# Patient Record
Sex: Male | Born: 1965 | Race: Black or African American | Hispanic: No | Smoking: Current every day smoker
Health system: Southern US, Community
[De-identification: ages and names within clinical notes are randomized; demographics above are authoritative.]

## PROBLEM LIST (undated history)

## (undated) DIAGNOSIS — K5792 Diverticulitis of intestine, part unspecified, without perforation or abscess without bleeding: Secondary | ICD-10-CM

---

## 2017-03-31 HISTORY — PX: COLONOSCOPY W/ POLYPECTOMY: SHX1380

## 2018-04-07 ENCOUNTER — Emergency Department (HOSPITAL_COMMUNITY): Payer: Self-pay

## 2018-04-07 ENCOUNTER — Encounter (HOSPITAL_COMMUNITY): Payer: Self-pay | Admitting: Emergency Medicine

## 2018-04-07 ENCOUNTER — Other Ambulatory Visit: Payer: Self-pay

## 2018-04-07 ENCOUNTER — Emergency Department (HOSPITAL_COMMUNITY)
Admission: EM | Admit: 2018-04-07 | Discharge: 2018-04-07 | Disposition: A | Discharge status: Home / Self Care | Payer: Self-pay | Attending: Emergency Medicine | Admitting: Emergency Medicine

## 2018-04-07 DIAGNOSIS — R1032 Left lower quadrant pain: Secondary | ICD-10-CM | POA: Insufficient documentation

## 2018-04-07 DIAGNOSIS — F17210 Nicotine dependence, cigarettes, uncomplicated: Secondary | ICD-10-CM | POA: Insufficient documentation

## 2018-04-07 HISTORY — DX: Diverticulitis of intestine, part unspecified, without perforation or abscess without bleeding: K57.92

## 2018-04-07 LAB — CBC
HCT: 42.5 % (ref 39.0–52.0)
Hemoglobin: 13.3 g/dL (ref 13.0–17.0)
MCH: 29.4 pg (ref 26.0–34.0)
MCHC: 31.3 g/dL (ref 30.0–36.0)
MCV: 93.8 fL (ref 80.0–100.0)
NRBC: 0 % (ref 0.0–0.2)
Platelets: 197 10*3/uL (ref 150–400)
RBC: 4.53 MIL/uL (ref 4.22–5.81)
RDW: 13.2 % (ref 11.5–15.5)
WBC: 4.2 10*3/uL (ref 4.0–10.5)

## 2018-04-07 LAB — COMPREHENSIVE METABOLIC PANEL
ALT: 21 U/L (ref 0–44)
AST: 24 U/L (ref 15–41)
Albumin: 3.8 g/dL (ref 3.5–5.0)
Alkaline Phosphatase: 56 U/L (ref 38–126)
Anion gap: 7 (ref 5–15)
BUN: 12 mg/dL (ref 6–20)
CO2: 26 mmol/L (ref 22–32)
Calcium: 9.6 mg/dL (ref 8.9–10.3)
Chloride: 108 mmol/L (ref 98–111)
Creatinine, Ser: 1.41 mg/dL — ABNORMAL HIGH (ref 0.61–1.24)
GFR calc non Af Amer: 56 mL/min — ABNORMAL LOW (ref >60)
Glucose, Bld: 116 mg/dL — ABNORMAL HIGH (ref 70–99)
Potassium: 4 mmol/L (ref 3.5–5.1)
SODIUM: 141 mmol/L (ref 135–145)
Total Bilirubin: 0.9 mg/dL (ref 0.3–1.2)
Total Protein: 7.3 g/dL (ref 6.5–8.1)

## 2018-04-07 LAB — URINALYSIS, ROUTINE W REFLEX MICROSCOPIC
Bilirubin Urine: NEGATIVE
Glucose, UA: NEGATIVE mg/dL
Hgb urine dipstick: NEGATIVE
Ketones, ur: NEGATIVE mg/dL
LEUKOCYTES UA: NEGATIVE
Nitrite: NEGATIVE
Protein, ur: NEGATIVE mg/dL
Specific Gravity, Urine: 1.027 (ref 1.005–1.030)
pH: 6 (ref 5.0–8.0)

## 2018-04-07 LAB — LIPASE, BLOOD: Lipase: 31 U/L (ref 11–51)

## 2018-04-07 MED ORDER — IOHEXOL 300 MG/ML  SOLN
100.0000 mL | Freq: Once | INTRAMUSCULAR | Status: AC | PRN
  Administered 2018-04-07: 100 mL via INTRAVENOUS

## 2018-04-07 MED ORDER — SODIUM CHLORIDE 0.9 % IV BOLUS
1000.0000 mL | Freq: Once | INTRAVENOUS | Status: AC
  Administered 2018-04-07: 1000 mL via INTRAVENOUS

## 2018-04-07 NOTE — ED Provider Notes (Signed)
Care assumed from previous provider PA Leaphart. Please see note for further details. Case discussed, plan agreed upon. Will follow up on pending UA and CT abd/pelvis. If unremarkable, likely discharge to home with PCP follow up.   UA unremarkable. CT with no acute findings.   Gen: afebrile, VSS HEENT: Atraumatic Resp: no resp distress CV: RRR Abd: soft, no abdominal tenderness MsK: moving all extremities well Neuro: A&O x4   Patient updated on reassuring results.  PCP follow-up strongly encouraged.  Reasons to return to the emergency department were discussed.  All questions answered.   Ward, Chase Picket, PA-C 04/07/18 1713    Sabas Sous, MD 04/07/18 [phone]

## 2018-04-07 NOTE — ED Notes (Signed)
Patient verbalizes understanding of discharge instructions. Opportunity for questioning and answers were provided. Armband removed by staff, pt discharged from ED.  

## 2018-04-07 NOTE — ED Provider Notes (Signed)
[name] EMERGENCY [facility]PARTMENT Provider Note   CSN: [id number] Arrival date & time: 04/07/18  1252     History   Chief Complaint Chief Complaint  Patient presents with  . Abdominal Pain    HPI Andrew Frazier is a 53 y.o. male.  HPI 53 year old African-American male past medical history significant for diverticulitis presents to the emergency department today for evaluation of left lower quadrant abdominal pain and left flank pain.  The patient states this pain is been ongoing intermittently for the past 1 to 2 years.  Patient believes that his diverticulitis is flaring up.  Patient states that he was seen by GI doctor approximately 1 to 2 years ago where he had a colonoscopy withdrawn and polyps were removed.  Patient states that since then he had ongoing left lower quadrant abdominal pain.  Patient reports some mild nausea but denies any vomiting.  He states that he does have sometimes difficulties with passing stool and will hit his belly and stool come out.  Patient denies any urinary symptoms.  Denies any vomiting but reports some mild nausea.  Denies any fevers or chills.  He has not taken anything for his pain prior to arrival.  In the past patient has used MiraLAX for constipation which has helped.  He reports intermittent bloody stools and last episode was 1 week ago but denies any recent bloody stools.  Pain is not associated with food.  Pain is worse with palpation.  He states the pain will sometimes radiate down his left buttocks.  Denies any paresthesias or weakness.  Denies any recent trauma.  Denies any loss of bowel or bladder, saddle paresthesias, lower extremity paresthesias, urinary retention. Past Medical History:  Diagnosis Date  . Diverticulitis     There are no active problems to display for this patient.   Past Surgical History:  Procedure Laterality Date  . COLONOSCOPY W/ POLYPECTOMY  2019        Home Medications    Prior to Admission  medications   Not on File    Family History No family history on file.  Social History Social History   Tobacco Use  . Smoking status: Current Every Day Smoker    Types: Cigarettes  . Smokeless tobacco: Never Used  Substance Use Topics  . Alcohol use: Not Currently  . Drug use: Not Currently     Allergies   Patient has no known allergies.   Review of Systems Review of Systems  Constitutional: Negative for chills and fever.  Eyes: Negative for visual disturbance.  Respiratory: Negative for shortness of breath.   Cardiovascular: Negative for chest pain.  Gastrointestinal: Positive for abdominal pain and nausea. Negative for vomiting.  Genitourinary: Negative for dysuria, flank pain, frequency, hematuria and urgency.  Musculoskeletal: Negative for myalgias.  Skin: Negative for rash.  Neurological: Negative for headaches.     Physical Exam Updated Vital Signs BP 115/74 (BP Location: Left Arm)   Pulse 90   Temp 98.2 F (36.8 C) (Oral)   Resp 16   Ht 5\' 8"  (1.727 m)   Wt 70.3 kg   SpO2 98%   BMI 23.57 kg/m   Physical Exam Vitals signs and nursing note reviewed.  Constitutional:      General: He is not in acute distress.    Appearance: He is well-developed. He is not toxic-appearing.  HENT:     Head: Normocephalic and atraumatic.  Eyes:     General:  Right eye: No discharge.        Left eye: No discharge.     Conjunctiva/sclera: Conjunctivae normal.     Pupils: Pupils are equal, round, and reactive to light.  Neck:     Musculoskeletal: Normal range of motion and neck supple.  Cardiovascular:     Rate and Rhythm: Normal rate and regular rhythm.     Heart sounds: Normal heart sounds. No murmur. No friction rub. No gallop.   Pulmonary:     Effort: Pulmonary effort is normal. No respiratory distress.     Breath sounds: Normal breath sounds. No stridor. No wheezing, rhonchi or rales.  Chest:     Chest wall: No tenderness.  Abdominal:     General:  Abdomen is flat. Bowel sounds are normal. There is no distension.     Palpations: Abdomen is soft.     Tenderness: There is generalized abdominal tenderness and tenderness in the left lower quadrant. There is no right CVA tenderness, left CVA tenderness, guarding or rebound. Negative signs include Murphy's sign and Rovsing's sign.  Musculoskeletal: Normal range of motion.        General: No tenderness.  Lymphadenopathy:     Cervical: No cervical adenopathy.  Skin:    General: Skin is warm and dry.     Capillary Refill: Capillary refill takes less than 2 seconds.     Findings: No rash.  Neurological:     Mental Status: He is alert and oriented to person, place, and time.  Psychiatric:        Mood and Affect: Mood normal.        Behavior: Behavior normal.        Thought Content: Thought content normal.        Judgment: Judgment normal.      ED Treatments / Results  Labs (all labs ordered are listed, but only abnormal results are displayed) Labs Reviewed  COMPREHENSIVE METABOLIC PANEL - Abnormal; Notable for the following components:      Result Value   Glucose, Bld 116 (*)    Creatinine, Ser 1.41 (*)    GFR calc non Af Amer 56 (*)    All other components within normal limits  LIPASE, BLOOD  CBC  URINALYSIS, ROUTINE W REFLEX MICROSCOPIC    EKG None  Radiology No results found.  Procedures Procedures (including critical care time)  Medications Ordered in ED Medications  sodium chloride 0.9 % bolus 1,000 mL (1,000 mLs Intravenous New Bag/Given 04/07/18 1449)     Initial Impression / Assessment and Plan / ED Course  I have reviewed the triage vital signs and the nursing notes.  Pertinent labs & imaging results that were available during my care of the patient were reviewed by me and considered in my medical decision making (see chart for details).     Patient presents to the ED for evaluation of left lower quadrant abdominal pain with history of diverticulitis.   States this feels similar.  Patient is nontoxic or septic appearing.  Vital signs reassuring.  Patient afebrile.  Does have some pain to palpation left lower quadrant but no signs of peritonitis.  Her lab work shows no leukocytosis.  Hemoglobin at baseline is at baseline.  Mild elevation in creatinine to 1.4 unknown baseline.  Normal liver enzymes.  Lipase normal.  UA pending at this time.  Obtaining CT scan which is pending.  Will sign out to oncoming divider.  Care handoff to PA Ward. Pt has pending at this  time ct imaging, urine and repeat assessment.  Disposition likely home pending lab and test results. Care dicussed and plan agreed upon with oncoming PA. Pt updated on plan of care and is currently hemodynamically stable at this time with normal vs.     Final Clinical Impressions(s) / ED Diagnoses   Final diagnoses:  LLQ abdominal pain    ED Discharge Orders    None       Wallace Keller 04/07/18 1547    Sabas Sous, MD 04/09/18 1258

## 2018-04-07 NOTE — ED Triage Notes (Signed)
Pt with abdominal pain. He reports feeling his diverticulitis is flaring up. Constipation and gas persist after otc interventions.

## 2018-04-07 NOTE — Discharge Instructions (Signed)
It was my pleasure taking care of you today!   Fortunately, your labs, urine sample and CT scan all were reassuring.  Please follow-up with your primary care doctor.  If you do not have one, see the information below.  Return to the emergency department for new or worsening symptoms, any additional concerns.  To find a primary care or specialty doctor please call [phone] or [phone] to access "[facility] Find a Doctor Service."  You may also go on the [facility] website at [email or web address]  There are also multiple [facility] practices throughout the [place] that are frequently accepting new patients. You may find a clinic that is close to your home and contact them.  [facility] [place] [address] [place] [address] [phone]  [facility] in [place] (also locations in [place] and [place]) - [address] [employer] [place] [phone]  [facility] Department - [address] [place] [phone]

## 2019-07-06 IMAGING — CT CT ABD-PELV W/ CM
2 of 5 series · 16 of 46 positions shown, 18 images · IV contrast (omnipaque)
Comparison: None.

CLINICAL DATA: Lower abdominal pain

EXAM:
CT ABDOMEN AND PELVIS WITH CONTRAST
TECHNIQUE: Multidetector CT imaging of the abdomen and pelvis was performed
using the standard protocol following bolus administration of
intravenous contrast.
CONTRAST:  100mL OMNIPAQUE 300

[Series 3: abd/ pelvis 5.0 i30f 2 · axial · 0.75mm/px · z∈[-684,-309]mm · 13 of 85 slices shown, 15 images]
[im 5/85  soft-tissue]
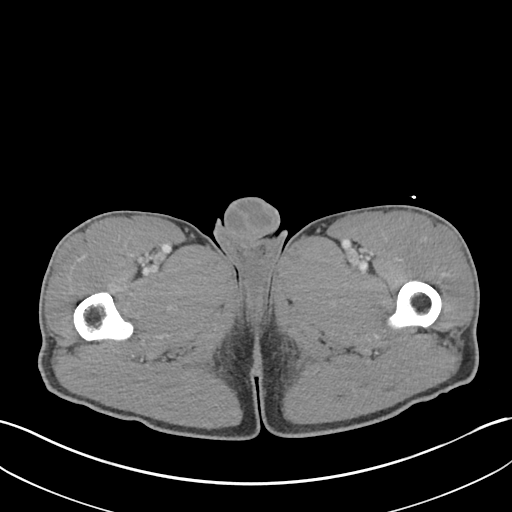
[im 5/85  bone]
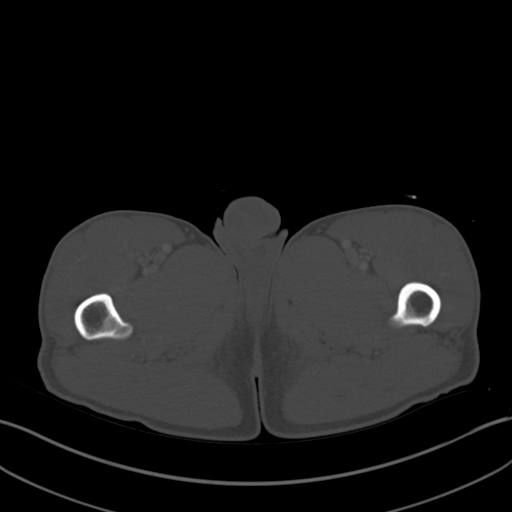
[im 10/85  soft-tissue]
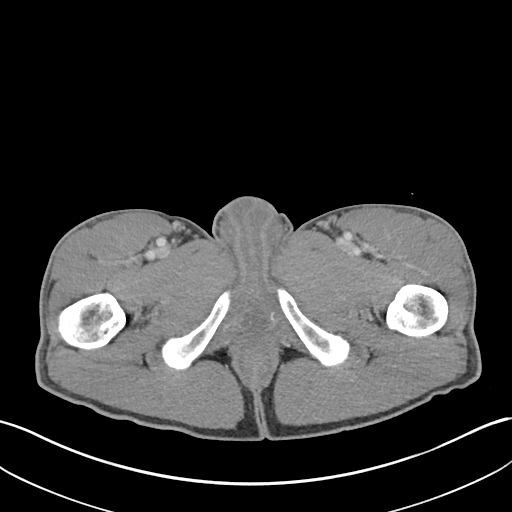
[im 19/85  soft-tissue]
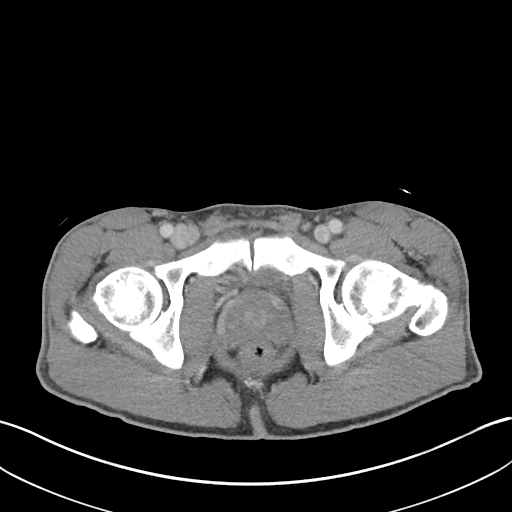
[im 24/85  soft-tissue]
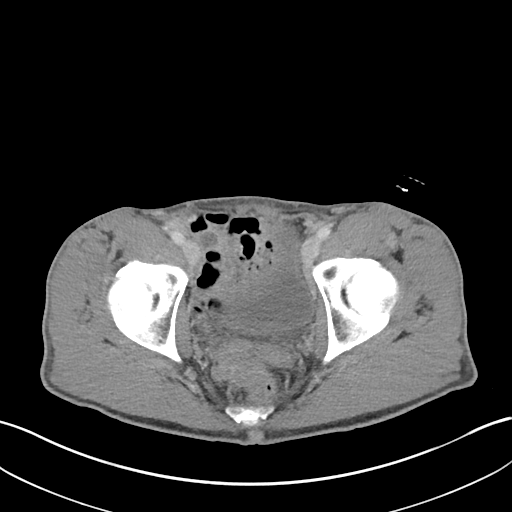
[im 29/85  soft-tissue]
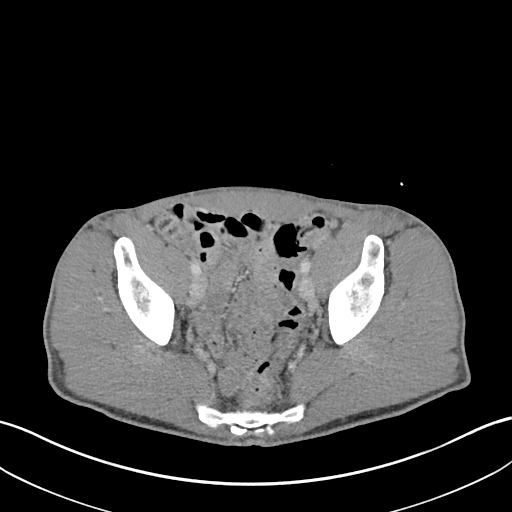
[im 38/85  soft-tissue]
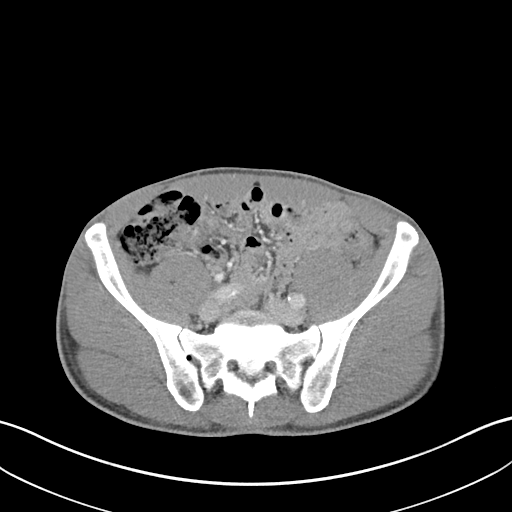
[im 43/85  soft-tissue]
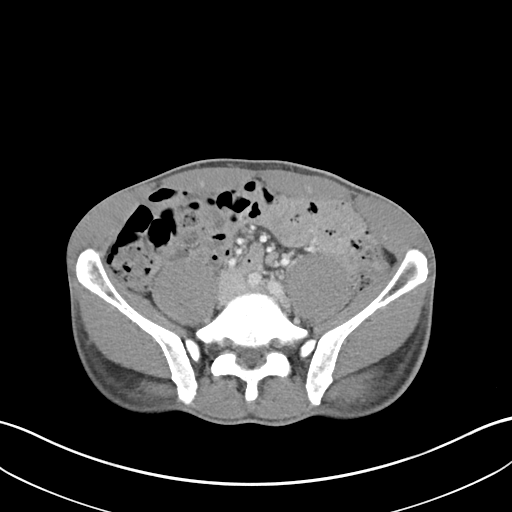
[im 47/85  soft-tissue]
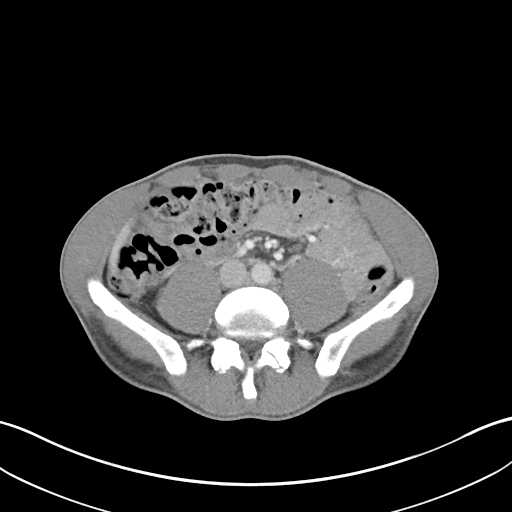
[im 57/85  soft-tissue]
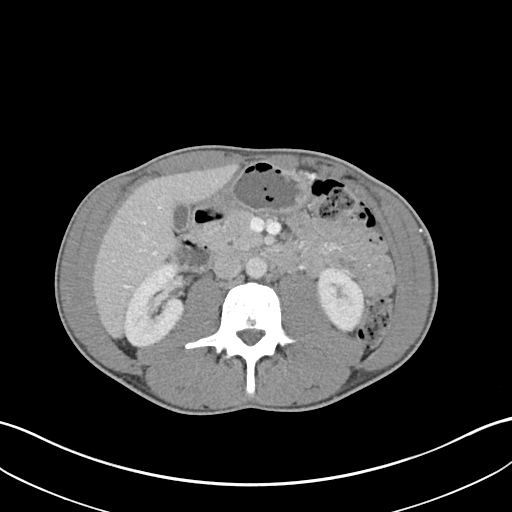
[im 57/85  bone]
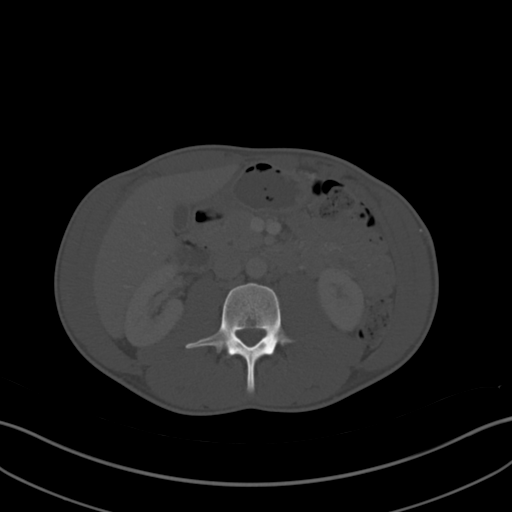
[im 61/85  soft-tissue]
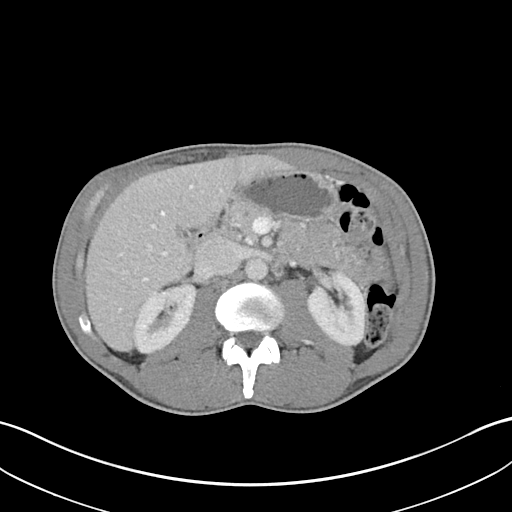
[im 66/85  soft-tissue]
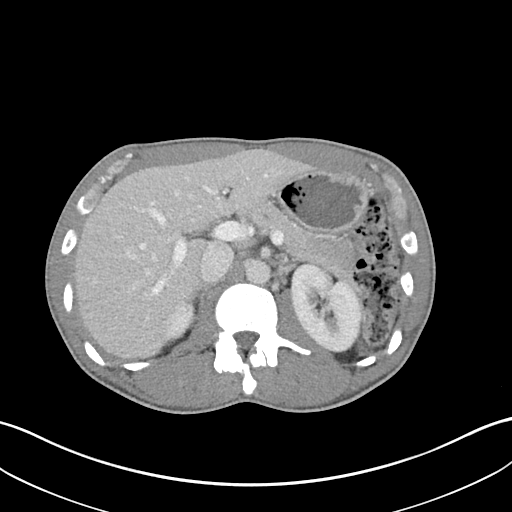
[im 75/85  soft-tissue]
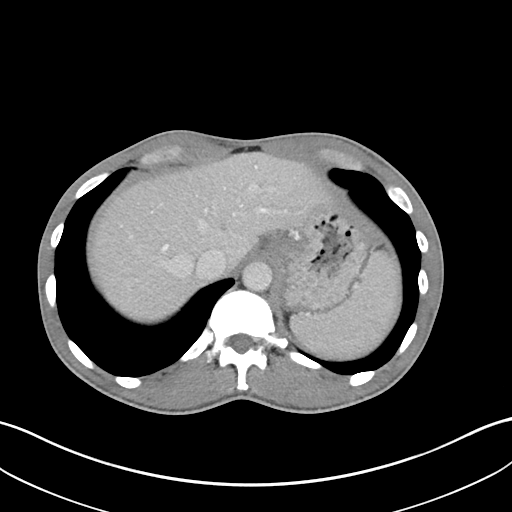
[im 80/85  soft-tissue]
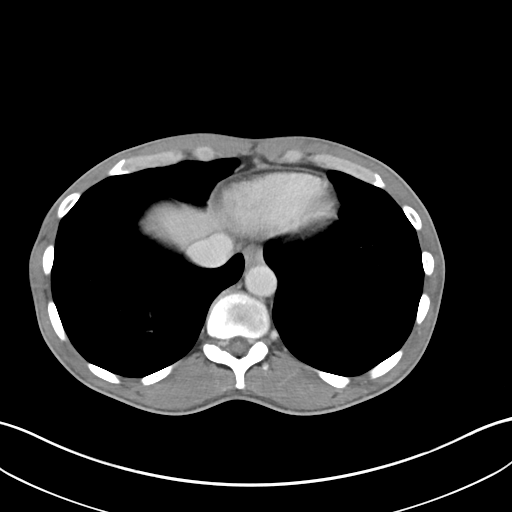

[Series 6: coronal soft tissue · coronal · 0.69mm/px · 3 of 85 slices shown]
[im 29/85  soft-tissue]
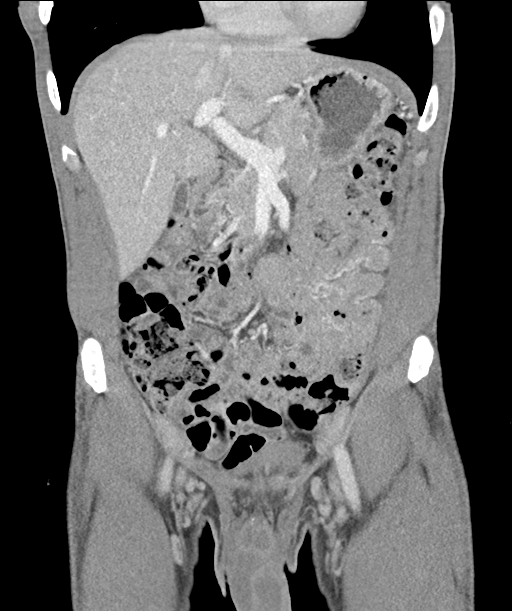
[im 38/85  soft-tissue]
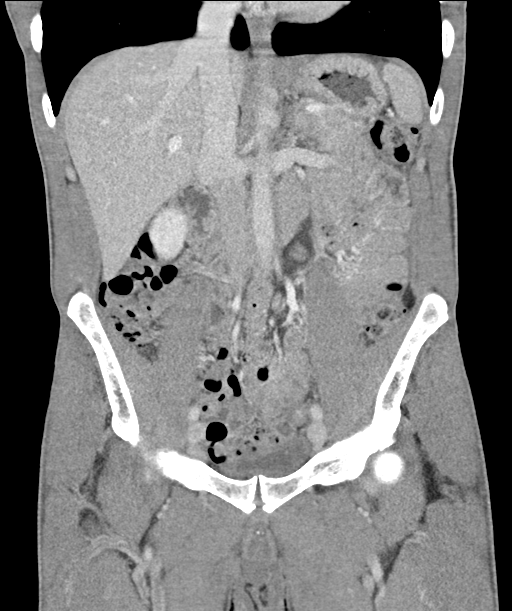
[im 47/85  soft-tissue]
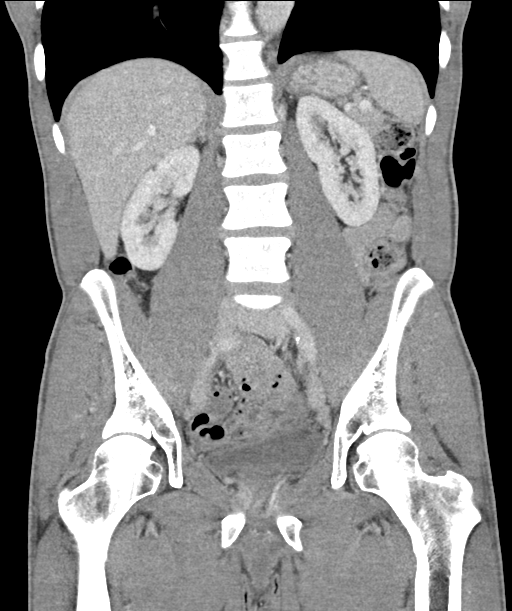

[16 of 46 positions shown; findings below may reference images not displayed]

FINDINGS: Lower chest: No acute abnormality.

Hepatobiliary: No focal liver abnormality is seen. No gallstones,
gallbladder wall thickening, or biliary dilatation.

Pancreas: Unremarkable. No pancreatic ductal dilatation or
surrounding inflammatory changes.

Spleen: Normal in size without focal abnormality.

Adrenals/Urinary Tract: Adrenal glands are within normal limits.
Kidneys are well visualized without renal calculi or urinary tract
obstructive changes. The bladder is well distended.

Stomach/Bowel: The appendix is not well visualized although no
inflammatory changes to suggest appendicitis are noted. No
obstructive or inflammatory changes are seen. The stomach is within
normal limits. No findings to suggest diverticulitis are noted.

Vascular/Lymphatic: No significant vascular findings are present. No
enlarged abdominal or pelvic lymph nodes.

Reproductive: Prostate is unremarkable.

Other: No abdominal wall hernia or abnormality. No abdominopelvic
ascites.

Musculoskeletal: No acute or significant osseous findings.
IMPRESSION: No acute abnormality noted.

## 2020-10-24 ENCOUNTER — Encounter (HOSPITAL_COMMUNITY): Payer: Self-pay | Admitting: Emergency Medicine

## 2020-10-24 ENCOUNTER — Emergency Department (HOSPITAL_COMMUNITY)
Admission: EM | Admit: 2020-10-24 | Discharge: 2020-10-25 | Disposition: A | Discharge status: Home / Self Care | Attending: Emergency Medicine | Admitting: Emergency Medicine

## 2020-10-24 DIAGNOSIS — M545 Low back pain, unspecified: Secondary | ICD-10-CM | POA: Insufficient documentation

## 2020-10-24 DIAGNOSIS — F17210 Nicotine dependence, cigarettes, uncomplicated: Secondary | ICD-10-CM | POA: Insufficient documentation

## 2020-10-24 DIAGNOSIS — K5900 Constipation, unspecified: Secondary | ICD-10-CM | POA: Diagnosis not present

## 2020-10-24 DIAGNOSIS — R1032 Left lower quadrant pain: Secondary | ICD-10-CM | POA: Diagnosis present

## 2020-10-24 DIAGNOSIS — R001 Bradycardia, unspecified: Secondary | ICD-10-CM | POA: Diagnosis not present

## 2020-10-24 DIAGNOSIS — I10 Essential (primary) hypertension: Secondary | ICD-10-CM | POA: Diagnosis not present

## 2020-10-24 LAB — CBC WITH DIFFERENTIAL/PLATELET
Abs Immature Granulocytes: 0.01 10*3/uL (ref 0.00–0.07)
Basophils Absolute: 0 10*3/uL (ref 0.0–0.1)
Basophils Relative: 1 %
Eosinophils Absolute: 0 10*3/uL (ref 0.0–0.5)
Eosinophils Relative: 1 %
HCT: 41.4 % (ref 39.0–52.0)
Hemoglobin: 13.4 g/dL (ref 13.0–17.0)
Immature Granulocytes: 0 %
Lymphocytes Relative: 19 %
Lymphs Abs: 0.8 10*3/uL (ref 0.7–4.0)
MCH: 30.7 pg (ref 26.0–34.0)
MCHC: 32.4 g/dL (ref 30.0–36.0)
MCV: 95 fL (ref 80.0–100.0)
Monocytes Absolute: 0.3 10*3/uL (ref 0.1–1.0)
Monocytes Relative: 8 %
Neutro Abs: 2.9 10*3/uL (ref 1.7–7.7)
Neutrophils Relative %: 71 %
Platelets: 181 10*3/uL (ref 150–400)
RBC: 4.36 MIL/uL (ref 4.22–5.81)
RDW: 13.2 % (ref 11.5–15.5)
WBC: 4 10*3/uL (ref 4.0–10.5)
nRBC: 0 % (ref 0.0–0.2)

## 2020-10-24 LAB — COMPREHENSIVE METABOLIC PANEL
ALT: 27 U/L (ref 0–44)
AST: 24 U/L (ref 15–41)
Albumin: 3.9 g/dL (ref 3.5–5.0)
Alkaline Phosphatase: 56 U/L (ref 38–126)
Anion gap: 4 — ABNORMAL LOW (ref 5–15)
BUN: 9 mg/dL (ref 6–20)
CO2: 30 mmol/L (ref 22–32)
Calcium: 9.6 mg/dL (ref 8.9–10.3)
Chloride: 104 mmol/L (ref 98–111)
Creatinine, Ser: 1.25 mg/dL — ABNORMAL HIGH (ref 0.61–1.24)
GFR, Estimated: >60 mL/min (ref >60)
Glucose, Bld: 88 mg/dL (ref 70–99)
Potassium: 4.2 mmol/L (ref 3.5–5.1)
Sodium: 138 mmol/L (ref 135–145)
Total Bilirubin: 0.8 mg/dL (ref 0.3–1.2)
Total Protein: 7.1 g/dL (ref 6.5–8.1)

## 2020-10-24 LAB — URINALYSIS, ROUTINE W REFLEX MICROSCOPIC
Bilirubin Urine: NEGATIVE
Glucose, UA: NEGATIVE mg/dL
Hgb urine dipstick: NEGATIVE
Ketones, ur: NEGATIVE mg/dL
Leukocytes,Ua: NEGATIVE
Nitrite: NEGATIVE
Protein, ur: NEGATIVE mg/dL
Specific Gravity, Urine: 1.014 (ref 1.005–1.030)
pH: 6 (ref 5.0–8.0)

## 2020-10-24 NOTE — ED Provider Notes (Signed)
Emergency Medicine Provider Triage Evaluation Note  Teshawn Moan , a 55 y.o. male  was evaluated in triage.  Pt complains of presents with left lower quadrant pain, states she has been having this pain for about 10 years, he states he has a history of diverticulitis diverticulosis, states that over the last couple days he just cannot stand the pain, he endorses constipation, dark stools, he also notes that he has dysuria, urinary frequency, and left-sided flank pain, he states his urine for about a month's time, no history of kidney stones..  Review of Systems  Positive: Left lower quadrant pain, dysuria Negative: Nausea, vomiting, diarrhea  Physical Exam  BP 113/77 (BP Location: Left Arm)   Pulse 76   Temp 98.1 F (36.7 C) (Oral)   Resp 20   SpO2 100%  Gen:   Awake, no distress   Resp:  Normal effort  MSK:   Moves extremities without difficulty  Other:  Abdomen nondistended no guarding, rebound tenderness, peritoneal sign.  Medical Decision Making  Medically screening exam initiated at 1:53 PM.  Appropriate orders placed.  Demarkus Remmel was informed that the remainder of the evaluation will be completed by another provider, this initial triage assessment does not replace that evaluation, and the importance of remaining in the ED until their evaluation is complete.  Presents with abdominal pain patient will need further work-up.   Carroll Sage, PA-C 10/24/20 1355    Blane Ohara, MD 10/25/20 1725

## 2020-10-24 NOTE — ED Triage Notes (Signed)
Pt endorses LLQ pain for 5 years and chronic constipation. Last BM 4 days ago.

## 2020-10-25 MED ORDER — POLYETHYLENE GLYCOL 3350 17 G PO PACK: 17.0000 g | PACK | Freq: Two times a day (BID) | ORAL | 0 refills | Status: AC

## 2020-10-25 MED ORDER — CIPROFLOXACIN HCL 500 MG PO TABS: 500.0000 mg | ORAL_TABLET | Freq: Two times a day (BID) | ORAL | 0 refills | Status: AC

## 2020-10-25 MED ORDER — METHOCARBAMOL 500 MG PO TABS: 500.0000 mg | ORAL_TABLET | Freq: Two times a day (BID) | ORAL | 0 refills | Status: AC

## 2020-10-25 MED ORDER — OMEPRAZOLE 20 MG PO CPDR: 20.0000 mg | DELAYED_RELEASE_CAPSULE | Freq: Every day | ORAL | 0 refills | Status: AC

## 2020-10-25 MED ORDER — METRONIDAZOLE 500 MG PO TABS: 500.0000 mg | ORAL_TABLET | Freq: Three times a day (TID) | ORAL | 0 refills | Status: AC

## 2020-10-25 NOTE — ED Provider Notes (Signed)
[name] EMERGENCY DEPARTMENT Provider Note   CSN: [id number] Arrival date & time: 10/24/20  1230     History Chief Complaint  Patient presents with   Abdominal Pain    Andrew Frazier is a 55 y.o. male presents to the Emergency Department complaining of gradual, persistent, LLQ abd pain onset 10 years ago.  Patient reports the pain waxes and wanes but is primarily constant.  Patient reports he has a history of diverticulitis and diverticulosis.  Reports the last time he was treated with antibiotics he did get better.  Additionally he endorses constipation and dark stools but denies melena or hematochezia.  Specifically denies black or tarry stools.  States he does take Pepto-Bismol sometimes.  Patient reports that he recently got out of jail and is trying to get established with a primary care.  Has not had any evaluation or treatment for this in some time.  Patient also complains of left lower back and left flank pain.  This is worse with significant activity, specifically climbing into and out of the top bunk that he sleeps in.  Reports that it occasionally radiates into his left buttock and left leg.  No radiation today.  It has never caused weakness, numbness or saddle anesthesia.  Reports he is previously taking gabapentin for it but has not been taking anything recently.  Denies falls or new trauma.    Abdominal Pain Associated symptoms: no chest pain, no constipation, no cough, no diarrhea, no dysuria, no fatigue, no fever, no hematuria, no nausea, no shortness of breath and no vomiting       Past Medical History:  Diagnosis Date   Diverticulitis     There are no problems to display for this patient.   Past Surgical History:  Procedure Laterality Date   COLONOSCOPY W/ POLYPECTOMY  2019       No family history on file.  Social History   Tobacco Use   Smoking status: Every Day    Types: Cigarettes   Smokeless tobacco: Never  Substance Use Topics    Alcohol use: Not Currently   Drug use: Not Currently    Home Medications Prior to Admission medications   Medication Sig Start Date End Date Taking? Authorizing Provider  ciprofloxacin (CIPRO) 500 MG tablet Take 1 tablet (500 mg total) by mouth every 12 (twelve) hours for 10 days. 10/25/20 11/04/20 Yes Ricarda Atayde, Dahlia Client, PA-C  methocarbamol (ROBAXIN) 500 MG tablet Take 1 tablet (500 mg total) by mouth 2 (two) times daily. 10/25/20  Yes Markeda Narvaez, Dahlia Client, PA-C  metroNIDAZOLE (FLAGYL) 500 MG tablet Take 1 tablet (500 mg total) by mouth 3 (three) times daily. 10/25/20  Yes Bobbi Kozakiewicz, Dahlia Client, PA-C  omeprazole (PRILOSEC) 20 MG capsule Take 1 capsule (20 mg total) by mouth daily. 10/25/20  Yes Dezerae Freiberger, Dahlia Client, PA-C  polyethylene glycol (MIRALAX / GLYCOLAX) 17 g packet Take 17 g by mouth 2 (two) times daily for 14 days. 10/25/20 11/08/20 Yes Atlee Villers, Dahlia Client, PA-C    Allergies    Patient has no known allergies.  Review of Systems   Review of Systems  Constitutional:  Negative for appetite change, diaphoresis, fatigue, fever and unexpected weight change.  HENT:  Negative for mouth sores.   Eyes:  Negative for visual disturbance.  Respiratory:  Negative for cough, chest tightness, shortness of breath and wheezing.   Cardiovascular:  Negative for chest pain.  Gastrointestinal:  Positive for abdominal pain. Negative for constipation, diarrhea, nausea and vomiting.  Endocrine: Negative for polydipsia, polyphagia  and polyuria.  Genitourinary:  Negative for dysuria, frequency, hematuria and urgency.  Musculoskeletal:  Positive for back pain. Negative for neck stiffness.  Skin:  Negative for rash.  Allergic/Immunologic: Negative for immunocompromised state.  Neurological:  Negative for syncope, light-headedness and headaches.  Hematological:  Does not bruise/bleed easily.  Psychiatric/Behavioral:  Negative for sleep disturbance. The patient is not nervous/anxious.    Physical  Exam Updated Vital Signs BP (!) 144/109   Pulse (!) 55   Temp 97.8 F (36.6 C) (Oral)   Resp 16   SpO2 100%   Physical Exam Vitals and nursing note reviewed.  Constitutional:      General: He is not in acute distress.    Appearance: He is well-developed. He is not diaphoretic.  HENT:     Head: Normocephalic and atraumatic.     Mouth/Throat:     Pharynx: No oropharyngeal exudate.  Eyes:     General: No scleral icterus.    Conjunctiva/sclera: Conjunctivae normal.  Neck:     Comments: Full ROM without pain Cardiovascular:     Rate and Rhythm: Regular rhythm. Bradycardia present.     Pulses: Normal pulses.          Radial pulses are 2+ on the right side and 2+ on the left side.  Pulmonary:     Effort: Pulmonary effort is normal. No tachypnea, accessory muscle usage, prolonged expiration, respiratory distress or retractions.     Comments: Equal chest rise. No increased work of breathing. Abdominal:     General: There is no distension.     Palpations: Abdomen is soft.     Tenderness: There is no abdominal tenderness. There is no guarding or rebound.  Musculoskeletal:        General: Normal range of motion.     Cervical back: Normal range of motion and neck supple.     Comments: Full range of motion of the T-spine and L-spine No midline tenderness to the  T-spine or L-spine Tenderness to palpation of the left paraspinous muscles of the L-spine  Lymphadenopathy:     Cervical: No cervical adenopathy.  Skin:    General: Skin is warm and dry.     Capillary Refill: Capillary refill takes less than 2 seconds.     Findings: No erythema or rash.  Neurological:     Mental Status: He is alert.     GCS: GCS eye subscore is 4. GCS verbal subscore is 5. GCS motor subscore is 6.     Comments: Speech is clear and goal oriented.  Psychiatric:        Mood and Affect: Mood normal.        Behavior: Behavior normal.    ED Results / Procedures / Treatments   Labs (all labs ordered are  listed, but only abnormal results are displayed) Labs Reviewed  COMPREHENSIVE METABOLIC PANEL - Abnormal; Notable for the following components:      Result Value   Creatinine, Ser 1.25 (*)    Anion gap 4 (*)    All other components within normal limits  CBC WITH DIFFERENTIAL/PLATELET  URINALYSIS, ROUTINE W REFLEX MICROSCOPIC     Procedures Procedures   Medications Ordered in ED Medications - No data to display  ED Course  I have reviewed the triage vital signs and the nursing notes.  Pertinent labs & imaging results that were available during my care of the patient were reviewed by me and considered in my medical decision making (see chart for details).  MDM Rules/Calculators/A&P                           Patient presents with 10 years of left lower quadrant abdominal pain.  It is not specifically worse today but he is continuing to be concerned about it.  Does report that previous treatments for diverticulitis have made him feel significantly better.  Abdomen is soft and nontender without rebound or guarding.  Labs are reassuring.  At this time I do not feel that patient needs a CT scan of his abdomen as he does not have peritoneal signs.  We will treat with Cipro and Flagyl.  Patient also complaining of left low back pain has been ongoing for some time.  Reports climbing in and out of the top bunk where he sleeps is worse for his back.  Reports a history of sciatica however none today.  Will give Robaxin for muscle spasms.  Patient also complains of constipation.  Reports at times dark stools after taking Pepto-Bismol but never black or tarry stools.  No bright red blood per rectum.  Patient reports that he intermittently takes MiraLAX but this does not seem to be helping.  He reports that he often drinks milk but knows that he is lactose intolerant.  Recommend patient take MiraLAX 2 times per day until regular bowel movements resume.  Additionally discussed patient importance of  keeping up with a food diary and not eating foods that make his stomach hurt.  Patient will be given Prilosec in addition.  Patient will have close follow-up with [facility].  Discussed reasons to return to the emergency department.  Patient states understanding and is in agreement with the plan.  Final Clinical Impression(s) / ED Diagnoses Final diagnoses:  LLQ abdominal pain  Acute left-sided low back pain without sciatica  Hypertension, unspecified type    Rx / DC Orders ED Discharge Orders          Ordered    polyethylene glycol (MIRALAX / GLYCOLAX) 17 g packet  2 times daily        10/25/20 0142    omeprazole (PRILOSEC) 20 MG capsule  Daily        10/25/20 0142    ciprofloxacin (CIPRO) 500 MG tablet  Every 12 hours        10/25/20 0142    metroNIDAZOLE (FLAGYL) 500 MG tablet  3 times daily        10/25/20 0142    methocarbamol (ROBAXIN) 500 MG tablet  2 times daily        10/25/20 0142             Taleisha Kaczynski, Boyd Kerbs 10/25/20 0148    Dione Booze, MD 10/25/20 [phone]

## 2020-10-25 NOTE — Discharge Instructions (Addendum)
1. Medications: Cipro and Flagyl - are the antibiotics that we talked about; omeprazole - is the medicine for acid reflux; miralax - is for constipation - take it 2x per day with plenty of water; Robaxin - muscle relaxer for your back pain, usual home medications 2. Treatment: rest, drink plenty of fluids, advance diet slowly 3. Follow Up: Please followup with your primary doctor in 2 days for discussion of your diagnoses and further evaluation after today's visit; if you do not have a primary care doctor use the resource guide provided to find one; Please return to the ER for persistent vomiting, high fevers or worsening symptoms

## 2020-11-13 ENCOUNTER — Encounter (HOSPITAL_COMMUNITY): Payer: Self-pay

## 2020-11-13 ENCOUNTER — Other Ambulatory Visit: Payer: Self-pay

## 2020-11-13 ENCOUNTER — Emergency Department (HOSPITAL_COMMUNITY)
Admission: EM | Admit: 2020-11-13 | Discharge: 2020-11-14 | Disposition: A | Discharge status: Home / Self Care | Attending: Emergency Medicine | Admitting: Emergency Medicine

## 2020-11-13 DIAGNOSIS — R110 Nausea: Secondary | ICD-10-CM | POA: Insufficient documentation

## 2020-11-13 DIAGNOSIS — K5900 Constipation, unspecified: Secondary | ICD-10-CM | POA: Diagnosis not present

## 2020-11-13 DIAGNOSIS — F17210 Nicotine dependence, cigarettes, uncomplicated: Secondary | ICD-10-CM | POA: Diagnosis not present

## 2020-11-13 DIAGNOSIS — R1084 Generalized abdominal pain: Secondary | ICD-10-CM

## 2020-11-13 NOTE — ED Provider Notes (Signed)
Emergency Medicine Provider Triage Evaluation Note  Andrew Frazier , a 55 y.o. male  was evaluated in triage.  Pt complains of constipation for years   Review of Systems  Positive: Constipation  Negative: No fever   Physical Exam  Ht 5\' 8"  (1.727 m)   Wt 72.6 kg   BMI 24.33 kg/m  Gen:   Awake, no distress   Resp:  Normal effort  MSK:   Moves extremities without difficulty  Other:   Medical Decision Making  Medically screening exam initiated at 5:46 PM.  Appropriate orders placed.  Andrew Frazier was informed that the remainder of the evaluation will be completed by another provider, this initial triage assessment does not replace that evaluation, and the importance of remaining in the ED until their evaluation is complete.     Moshe Cipro, Elson Areas 11/13/20 1747    11/15/20, MD 11/14/20 [phone]

## 2020-11-13 NOTE — ED Triage Notes (Signed)
Pt came in from home d/t chronic abd pain that has persisted for 10 years. States that he has not had a BM in 5 days & is usually constipated, coming out in "pebbles." 9/10 pain, endorses n/v.

## 2020-11-14 ENCOUNTER — Emergency Department (HOSPITAL_COMMUNITY)

## 2020-11-14 LAB — I-STAT CHEM 8, ED
BUN: 15 mg/dL (ref 6–20)
Calcium, Ion: 1.21 mmol/L (ref 1.15–1.40)
Chloride: 103 mmol/L (ref 98–111)
Creatinine, Ser: 1.4 mg/dL — ABNORMAL HIGH (ref 0.61–1.24)
Glucose, Bld: 81 mg/dL (ref 70–99)
HCT: 39 % (ref 39.0–52.0)
Hemoglobin: 13.3 g/dL (ref 13.0–17.0)
Potassium: 3.7 mmol/L (ref 3.5–5.1)
Sodium: 141 mmol/L (ref 135–145)
TCO2: 29 mmol/L (ref 22–32)

## 2020-11-14 LAB — COMPREHENSIVE METABOLIC PANEL
ALT: 24 U/L (ref 0–44)
AST: 21 U/L (ref 15–41)
Albumin: 3.6 g/dL (ref 3.5–5.0)
Alkaline Phosphatase: 59 U/L (ref 38–126)
Anion gap: 6 (ref 5–15)
BUN: 14 mg/dL (ref 6–20)
CO2: 28 mmol/L (ref 22–32)
Calcium: 9.5 mg/dL (ref 8.9–10.3)
Chloride: 105 mmol/L (ref 98–111)
Creatinine, Ser: 1.41 mg/dL — ABNORMAL HIGH (ref 0.61–1.24)
GFR, Estimated: 58 mL/min — ABNORMAL LOW (ref >60)
Glucose, Bld: 83 mg/dL (ref 70–99)
Potassium: 3.7 mmol/L (ref 3.5–5.1)
Sodium: 139 mmol/L (ref 135–145)
Total Bilirubin: 0.8 mg/dL (ref 0.3–1.2)
Total Protein: 6.9 g/dL (ref 6.5–8.1)

## 2020-11-14 LAB — CBC WITH DIFFERENTIAL/PLATELET
Abs Immature Granulocytes: 0.01 10*3/uL (ref 0.00–0.07)
Basophils Absolute: 0 10*3/uL (ref 0.0–0.1)
Basophils Relative: 1 %
Eosinophils Absolute: 0.1 10*3/uL (ref 0.0–0.5)
Eosinophils Relative: 2 %
HCT: 40.3 % (ref 39.0–52.0)
Hemoglobin: 12.9 g/dL — ABNORMAL LOW (ref 13.0–17.0)
Immature Granulocytes: 0 %
Lymphocytes Relative: 28 %
Lymphs Abs: 1.1 10*3/uL (ref 0.7–4.0)
MCH: 30.3 pg (ref 26.0–34.0)
MCHC: 32 g/dL (ref 30.0–36.0)
MCV: 94.6 fL (ref 80.0–100.0)
Monocytes Absolute: 0.5 10*3/uL (ref 0.1–1.0)
Monocytes Relative: 13 %
Neutro Abs: 2.3 10*3/uL (ref 1.7–7.7)
Neutrophils Relative %: 56 %
Platelets: 209 10*3/uL (ref 150–400)
RBC: 4.26 MIL/uL (ref 4.22–5.81)
RDW: 13.1 % (ref 11.5–15.5)
WBC: 4 10*3/uL (ref 4.0–10.5)
nRBC: 0 % (ref 0.0–0.2)

## 2020-11-14 MED ORDER — ONDANSETRON HCL 4 MG/2ML IJ SOLN
4.0000 mg | Freq: Once | INTRAMUSCULAR | Status: AC
  Administered 2020-11-14: 4 mg via INTRAVENOUS
  Filled 2020-11-14: qty 2

## 2020-11-14 MED ORDER — MAGNESIUM CITRATE PO SOLN: 1.0000 | Freq: Once | ORAL | 0 refills | Status: AC

## 2020-11-14 MED ORDER — IOHEXOL 350 MG/ML SOLN
80.0000 mL | Freq: Once | INTRAVENOUS | Status: AC | PRN
  Administered 2020-11-14: 80 mL via INTRAVENOUS

## 2020-11-14 NOTE — ED Provider Notes (Signed)
[name] EMERGENCY DEPARTMENT Provider Note   CSN: [id number] Arrival date & time: 11/13/20  1727     History Chief Complaint  Patient presents with   Abdominal Pain    Andrew Frazier is a 55 y.o. male presenting for evaluation of abdominal pain and constipation.  Patient states he has a long history of abdominal pain and constipation, has been evaluated in multiple states for this.  He reports colonoscopy which showed diverticulosis, and he has been treated for diverticulitis before.  Patient states he has been taking over-the-counter medications due to recent 5-day history of constipation but not having any improvement.  He states he is not passing any gas.  He reports associated nausea, no vomiting.  He denies fevers, chills, chest pain, shortness of breath, urinary symptoms.  He reports no other medical problems, takes no medications daily.  No abdominal surgeries.  Additional history obtained from chart review.  Patient has been seen twice in our health system for this, however had not had any scans or imaging done.  Most recent visit was a month ago.  HPI     Past Medical History:  Diagnosis Date   Diverticulitis     There are no problems to display for this patient.   Past Surgical History:  Procedure Laterality Date   COLONOSCOPY W/ POLYPECTOMY  2019       History reviewed. No pertinent family history.  Social History   Tobacco Use   Smoking status: Every Day    Types: Cigarettes   Smokeless tobacco: Never  Substance Use Topics   Alcohol use: Not Currently   Drug use: Not Currently    Home Medications Prior to Admission medications   Medication Sig Start Date End Date Taking? Authorizing Provider  magnesium citrate SOLN Take 296 mLs (1 Bottle total) by mouth once for 1 dose. 11/14/20 11/14/20 Yes Karoline Fleer, PA-C  methocarbamol (ROBAXIN) 500 MG tablet Take 1 tablet (500 mg total) by mouth 2 (two) times daily. 10/25/20    Muthersbaugh, Dahlia Client, PA-C  metroNIDAZOLE (FLAGYL) 500 MG tablet Take 1 tablet (500 mg total) by mouth 3 (three) times daily. 10/25/20   Muthersbaugh, Dahlia Client, PA-C  omeprazole (PRILOSEC) 20 MG capsule Take 1 capsule (20 mg total) by mouth daily. 10/25/20   Muthersbaugh, Dahlia Client, PA-C    Allergies    Ciprofloxacin  Review of Systems   Review of Systems  Gastrointestinal:  Positive for abdominal pain and constipation.  All other systems reviewed and are negative.  Physical Exam Updated Vital Signs BP 119/90 (BP Location: Right Arm)   Pulse (!) 59   Temp 97.6 F (36.4 C) (Oral)   Resp 17   Ht 5\' 8"  (1.727 m)   Wt 72.6 kg   SpO2 100%   BMI 24.33 kg/m   Physical Exam Vitals and nursing note reviewed.  Constitutional:      General: He is not in acute distress.    Appearance: Normal appearance.     Comments: nontoxic  HENT:     Head: Normocephalic and atraumatic.  Eyes:     Conjunctiva/sclera: Conjunctivae normal.     Pupils: Pupils are equal, round, and reactive to light.  Cardiovascular:     Rate and Rhythm: Normal rate and regular rhythm.     Pulses: Normal pulses.  Pulmonary:     Effort: Pulmonary effort is normal. No respiratory distress.     Breath sounds: Normal breath sounds. No wheezing.     Comments: Speaking in full  sentences.  Clear lung sounds in all fields. Abdominal:     General: There is no distension.     Palpations: Abdomen is soft.     Tenderness: There is abdominal tenderness.     Comments: Diffuse ttp of the abd  Musculoskeletal:        General: Normal range of motion.     Cervical back: Normal range of motion and neck supple.  Skin:    General: Skin is warm and dry.     Capillary Refill: Capillary refill takes less than 2 seconds.  Neurological:     Mental Status: He is alert and oriented to person, place, and time.  Psychiatric:        Mood and Affect: Mood and affect normal.        Speech: Speech normal.        Behavior: Behavior normal.     ED Results / Procedures / Treatments   Labs (all labs ordered are listed, but only abnormal results are displayed) Labs Reviewed  CBC WITH DIFFERENTIAL/PLATELET - Abnormal; Notable for the following components:      Result Value   Hemoglobin 12.9 (*)    All other components within normal limits  COMPREHENSIVE METABOLIC PANEL - Abnormal; Notable for the following components:   Creatinine, Ser 1.41 (*)    GFR, Estimated 58 (*)    All other components within normal limits  I-STAT CHEM 8, ED - Abnormal; Notable for the following components:   Creatinine, Ser 1.40 (*)    All other components within normal limits    EKG None  Radiology CT ABDOMEN PELVIS W CONTRAST  Result Date: 11/14/2020 CLINICAL DATA:  Bowel obstruction suspected EXAM: CT ABDOMEN AND PELVIS WITH CONTRAST TECHNIQUE: Multidetector CT imaging of the abdomen and pelvis was performed using the standard protocol following bolus administration of intravenous contrast. CONTRAST:  24mL OMNIPAQUE IOHEXOL 350 MG/ML SOLN COMPARISON:  04/07/2018 FINDINGS: Lower chest: Lung bases are clear. No effusions. Heart is normal size. Hepatobiliary: No focal hepatic abnormality. Gallbladder unremarkable. Pancreas: No focal abnormality or ductal dilatation. Spleen: No focal abnormality.  Normal size. Adrenals/Urinary Tract: No adrenal abnormality. No focal renal abnormality. No stones or hydronephrosis. Urinary bladder is unremarkable. Stomach/Bowel: Appendix not definitively seen. Stomach, large and small bowel grossly unremarkable. Vascular/Lymphatic: No evidence of aneurysm or adenopathy. Reproductive: No visible focal abnormality. Other: No free fluid or free air. Musculoskeletal: No acute bony abnormality. IMPRESSION: No acute findings in the abdomen or pelvis. Electronically Signed   By: Charlett Nose M.D.   On: 11/14/2020 03:49    Procedures Procedures   Medications Ordered in ED Medications  ondansetron (ZOFRAN) injection 4 mg (4 mg  Intravenous Given 11/14/20 0237)  iohexol (OMNIPAQUE) 350 MG/ML injection 80 mL (80 mLs Intravenous Contrast Given 11/14/20 0327)    ED Course  I have reviewed the triage vital signs and the nursing notes.  Pertinent labs & imaging results that were available during my care of the patient were reviewed by me and considered in my medical decision making (see chart for details).    MDM Rules/Calculators/A&P                           Pt presenting for evaluation of abdominal pain and constipation.  On exam, patient appears nontoxic.  He does have diffuse tenderness palpation of the abdomen.  He is reporting no bowel movement or passage of gas for 5 days.  While, due to  his long history of issues with abdominal pain and constipation, this is likely due to constipation, in the setting of acute worsening pain and symptoms will ensure there is no bowel obstruction.  Less likely to be infection such as diverticulitis as patient is afebrile and very well-appearing.  Labs and CT ordered.  Labs interpreted by me, overall reassuring.  CT negative for acute findings.  Discussed with patient.  Discussed symptomatic management for constipation.  Discussed importance of hydration and fiber intake.  Encourage follow-up with GI symptoms not improving.  At this time, patient appears safe for discharge.  Return precautions given.  Patient states he understands and agrees to plan.   Final Clinical Impression(s) / ED Diagnoses Final diagnoses:  Constipation, unspecified constipation type  Generalized abdominal pain    Rx / DC Orders ED Discharge Orders          Ordered    magnesium citrate SOLN   Once        11/14/20 0430             Hong Moring, PA-C 11/14/20 0649    Melene Plan, DO 11/15/20 [phone]

## 2020-11-14 NOTE — Discharge Instructions (Addendum)
Increase your water intake. Make sure you are eating plenty of fiber. Continue taking the stool softener and MiraLAX to help with regular bowel movements. Using mag citrate to help encourage bowel movement. Call the GI doctor was below if your symptoms not proving. Return to the emergency room with any new, worsening, concerning symptoms.

## 2020-11-28 ENCOUNTER — Emergency Department (HOSPITAL_COMMUNITY)
Admission: EM | Admit: 2020-11-28 | Discharge: 2020-11-28 | Disposition: A | Discharge status: Home / Self Care | Payer: Medicaid Other | Attending: Emergency Medicine | Admitting: Emergency Medicine

## 2020-11-28 ENCOUNTER — Encounter (HOSPITAL_COMMUNITY): Payer: Self-pay | Admitting: Emergency Medicine

## 2020-11-28 ENCOUNTER — Other Ambulatory Visit: Payer: Self-pay

## 2020-11-28 DIAGNOSIS — R1032 Left lower quadrant pain: Secondary | ICD-10-CM | POA: Insufficient documentation

## 2020-11-28 DIAGNOSIS — F17210 Nicotine dependence, cigarettes, uncomplicated: Secondary | ICD-10-CM | POA: Insufficient documentation

## 2020-11-28 DIAGNOSIS — Z760 Encounter for issue of repeat prescription: Secondary | ICD-10-CM | POA: Insufficient documentation

## 2020-11-28 DIAGNOSIS — G8929 Other chronic pain: Secondary | ICD-10-CM | POA: Insufficient documentation

## 2020-11-28 MED ORDER — GABAPENTIN 100 MG PO CAPS: 100.0000 mg | ORAL_CAPSULE | Freq: Three times a day (TID) | ORAL | 0 refills | Status: AC

## 2020-11-28 MED ORDER — AMOXICILLIN-POT CLAVULANATE 875-125 MG PO TABS: 1.0000 | ORAL_TABLET | Freq: Two times a day (BID) | ORAL | 0 refills | Status: AC

## 2020-11-28 NOTE — ED Provider Notes (Signed)
[facility] EMERGENCY DEPARTMENT Provider Note   CSN: [id number] Arrival date & time: 11/28/20  1442     History Chief Complaint  Patient presents with   Medication Refill    Andrew Frazier is a 55 y.o. male.  The history is provided by the patient and medical records. No language interpreter was used.  Medication Refill  55 year old male significant history of diverticular disease presents requesting for antibiotic.  Patient states he has had recurrent pain to his left lower quadrant which is an ongoing issue for years.  Noticed increasing pain with eating.  It happened on almost a daily basis and usually would last for several hours.  He denies any associated fever dysuria diarrhea or constipation.  He reported previously was prescribed some medication that did help with his symptoms when he was incarcerated.  He recently was released from the jail house and would like further treatment of his symptoms.  Patient previously has a GI specialist but no longer have access to one.  He mention being seen several weeks ago for his complaint and was prescribed Cipro.  States that when he went to the pharmacy it was a medication that was recalled therefore he did not have access to the medication.  He tries to wait out but symptoms still persist thus prompting this ER visit.  Denies seeing any blood in the stool.  At this time his pain is mild  Past Medical History:  Diagnosis Date   Diverticulitis     There are no problems to display for this patient.   Past Surgical History:  Procedure Laterality Date   COLONOSCOPY W/ POLYPECTOMY  2019       No family history on file.  Social History   Tobacco Use   Smoking status: Every Day    Types: Cigarettes   Smokeless tobacco: Never  Substance Use Topics   Alcohol use: Not Currently   Drug use: Not Currently    Home Medications Prior to Admission medications   Medication Sig Start Date End Date Taking? Authorizing  Provider  methocarbamol (ROBAXIN) 500 MG tablet Take 1 tablet (500 mg total) by mouth 2 (two) times daily. 10/25/20   Muthersbaugh, Dahlia Client, PA-C  metroNIDAZOLE (FLAGYL) 500 MG tablet Take 1 tablet (500 mg total) by mouth 3 (three) times daily. 10/25/20   Muthersbaugh, Dahlia Client, PA-C  omeprazole (PRILOSEC) 20 MG capsule Take 1 capsule (20 mg total) by mouth daily. 10/25/20   Muthersbaugh, Dahlia Client, PA-C    Allergies    Ciprofloxacin  Review of Systems   Review of Systems  All other systems reviewed and are negative.  Physical Exam Updated Vital Signs BP 114/86 (BP Location: Right Arm)   Pulse 85   Temp 98.3 F (36.8 C) (Oral)   Resp 16   SpO2 100%   Physical Exam Vitals and nursing note reviewed.  Constitutional:      General: He is not in acute distress.    Appearance: He is well-developed.  HENT:     Head: Atraumatic.  Eyes:     Conjunctiva/sclera: Conjunctivae normal.  Cardiovascular:     Rate and Rhythm: Normal rate and regular rhythm.     Pulses: Normal pulses.     Heart sounds: Normal heart sounds.  Pulmonary:     Effort: Pulmonary effort is normal.     Breath sounds: Normal breath sounds.  Abdominal:     Palpations: Abdomen is soft.     Tenderness: There is abdominal tenderness (Mild left  lower quadrant tenderness no guarding or rebound tenderness.).  Musculoskeletal:     Cervical back: Neck supple.  Skin:    Findings: No rash.  Neurological:     Mental Status: He is alert.    ED Results / Procedures / Treatments   Labs (all labs ordered are listed, but only abnormal results are displayed) Labs Reviewed - No data to display  EKG None  Radiology No results found.  Procedures Procedures   Medications Ordered in ED Medications - No data to display  ED Course  I have reviewed the triage vital signs and the nursing notes.  Pertinent labs & imaging results that were available during my care of the patient were reviewed by me and considered in my medical  decision making (see chart for details).    MDM Rules/Calculators/A&P                           BP 114/86 (BP Location: Right Arm)   Pulse 85   Temp 98.3 F (36.8 C) (Oral)   Resp 16   SpO2 100%   Final Clinical Impression(s) / ED Diagnoses Final diagnoses:  Chronic LLQ pain    Rx / DC Orders ED Discharge Orders          Ordered    amoxicillin-clavulanate (AUGMENTIN) 875-125 MG tablet  Every 12 hours        11/28/20 1617    gabapentin (NEURONTIN) 100 MG capsule  3 times daily        11/28/20 1617           4:15 PM Patient report and wanting antibiotic to treat his diverticulitis.  Has history of diverticulosis and was seen couple weeks ago in the ED for same.  At that time the CT scan shows evidence of diverticulosis without evidence of diverticulitis.  He still have persistent recurrent pain.  Patient overall well-appearing.  He is allergic to ciprofloxacin.  In the event that this is diverticulitis I have low suspicion for perforation or abscess.  I am in agreement with prescribing Augmentin as treatment as well as Neurontin as he mention a provide relief of his symptoms in the past.  I strongly encourage patient to follow-up with a GI specialist for outpatient care.  Return precaution given.   Fayrene Helper, PA-C 11/28/20 1618    Jacalyn Lefevre, MD 11/28/20 [phone]

## 2020-11-28 NOTE — Discharge Instructions (Addendum)
Please follow-up with GI specialist for further managements of your chronic abdominal pain.  If you develop worsening pain fever or noticed any bloody stool, consider taking antibiotic prescribed.  Take Neurontin as needed for nerve pain.

## 2020-11-28 NOTE — ED Provider Notes (Signed)
Emergency Medicine Provider Triage Evaluation Note  Andrew Frazier , a 55 y.o. male  was evaluated in triage.  Pt complains of unable to fill a medication that he was prescribed for recent dx of diverticulitis. He endorses continuing nausea and a mass in his LLQ. He finished his course of antibiotics. The pain in his abdomen is only present when he lays down. He said we gave him something to coat his stomach that helped.  Review of Systems  Positive: Nausea, LLQ mass, constipation Negative: Vomiting, diarrhea, fever  Physical Exam  BP 114/86 (BP Location: Right Arm)   Pulse 85   Temp 98.3 F (36.8 C) (Oral)   SpO2 100%  Gen:   Awake, no distress   Resp:  Normal effort  MSK:   Moves extremities without difficulty  Other:  Minimal to no TTP of entire abdomen  Medical Decision Making  Medically screening exam initiated at 3:03 PM.  Appropriate orders placed.  Andrew Frazier was informed that the remainder of the evaluation will be completed by another provider, this initial triage assessment does not replace that evaluation, and the importance of remaining in the ED until their evaluation is complete.  Med refill, nausea, abdominal pain   Olene Floss, PA-C 11/28/20 1505    Mancel Bale, MD 11/29/20 [phone]

## 2020-11-28 NOTE — ED Triage Notes (Signed)
Pt states he was seen for abd pain and sent with home with cipro- Pt states the pharmacy would not fill due to a recall on the medication. Pt asking for new prescription and also something for constipation.

## 2022-02-12 IMAGING — CT CT ABD-PELV W/ CM
4 of 5 series · 13 of 46 positions shown, 18 images · IV contrast (omnipaque)
Comparison: 04/07/2018

CLINICAL DATA: Bowel obstruction suspected

EXAM:
CT ABDOMEN AND PELVIS WITH CONTRAST
TECHNIQUE: Multidetector CT imaging of the abdomen and pelvis was performed
using the standard protocol following bolus administration of
intravenous contrast.
CONTRAST:  80mL OMNIPAQUE IOHEXOL 350 MG/ML SOLN

[Series 3: abdomen 5.0 · axial · 0.67mm/px · z∈[-1109,-804]mm · 7 of 83 slices shown, 12 images]
[im 11/83  soft-tissue]
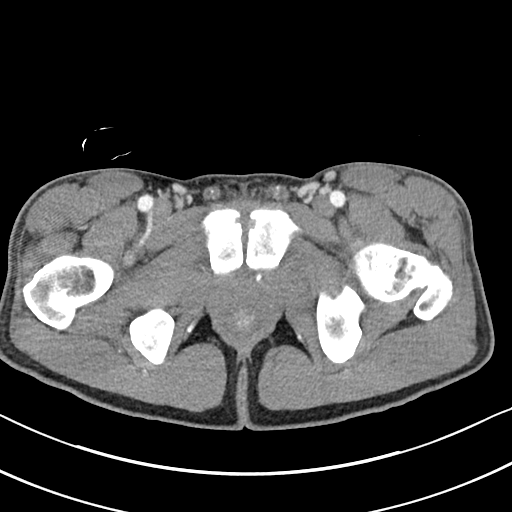
[im 11/83  bone]
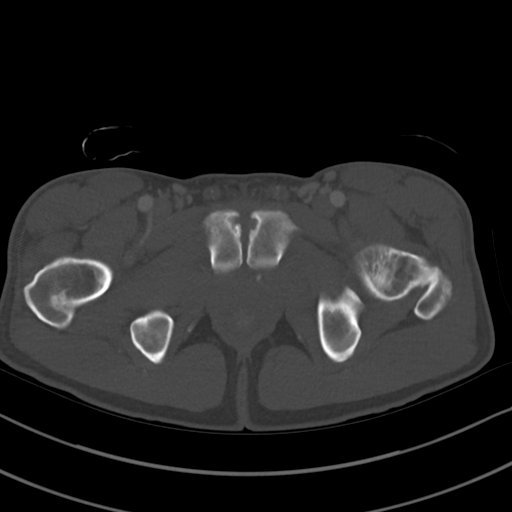
[im 21/83  soft-tissue]
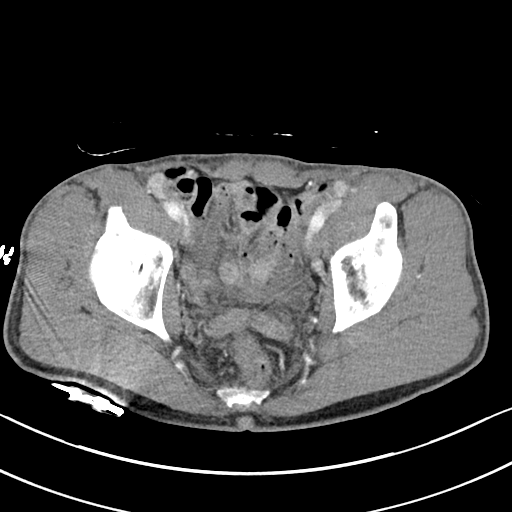
[im 31/83  soft-tissue]
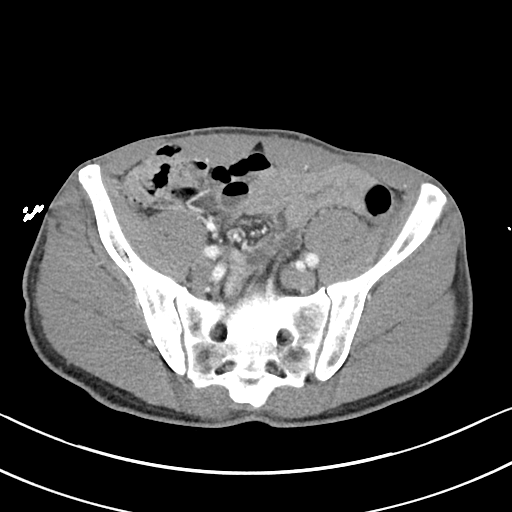
[im 42/83  soft-tissue]
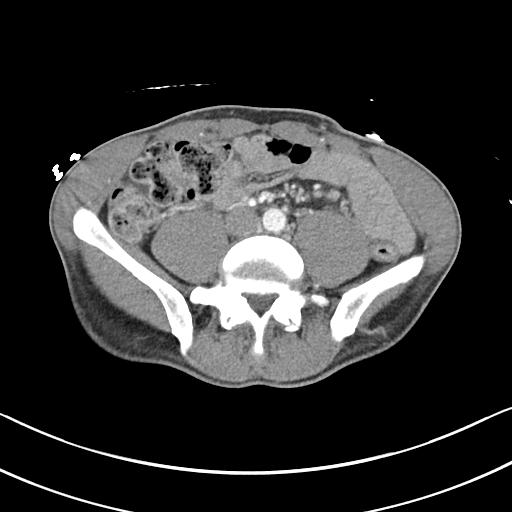
[im 42/83  lung]
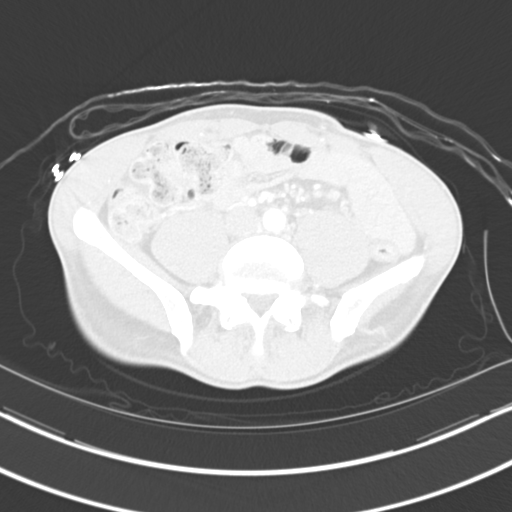
[im 52/83  soft-tissue]
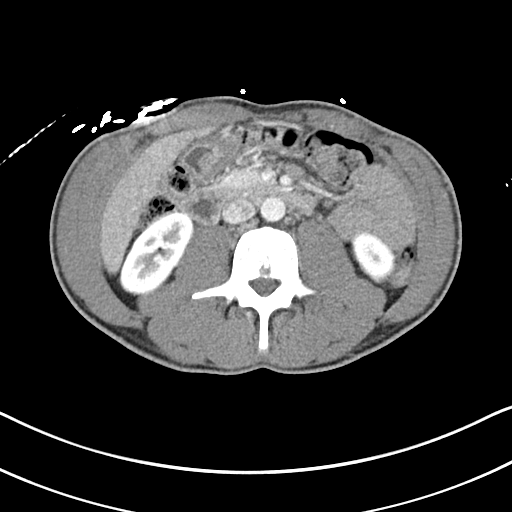
[im 52/83  lung]
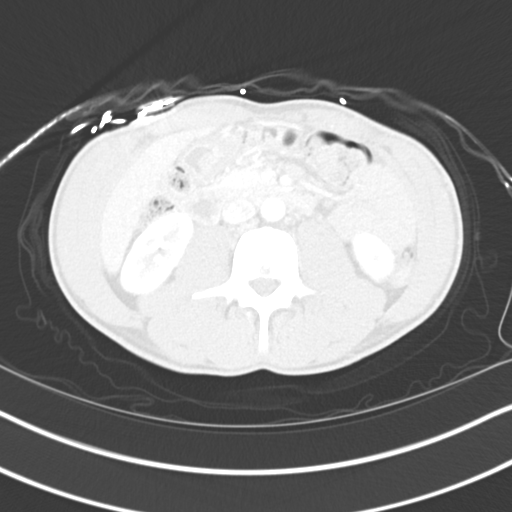
[im 62/83  soft-tissue]
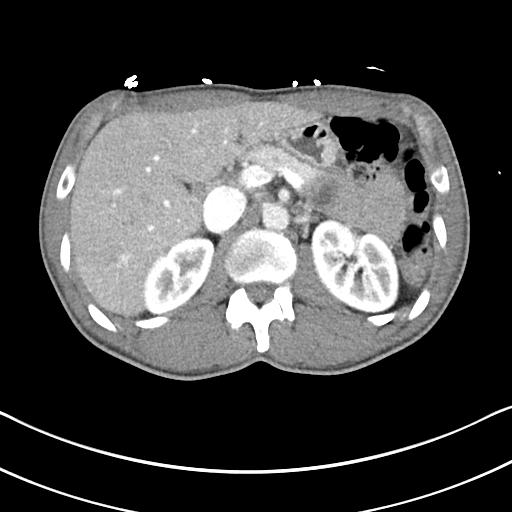
[im 62/83  lung]
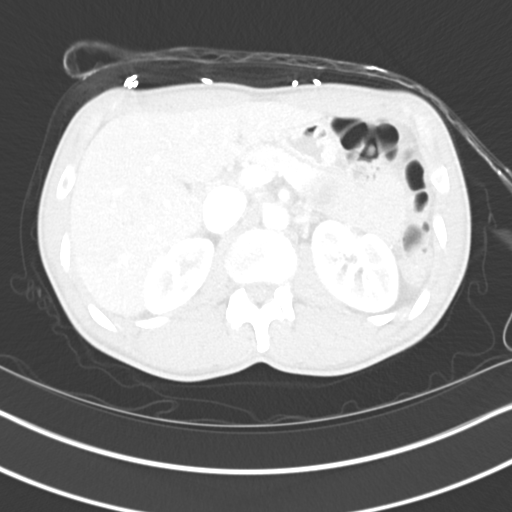
[im 72/83  soft-tissue]
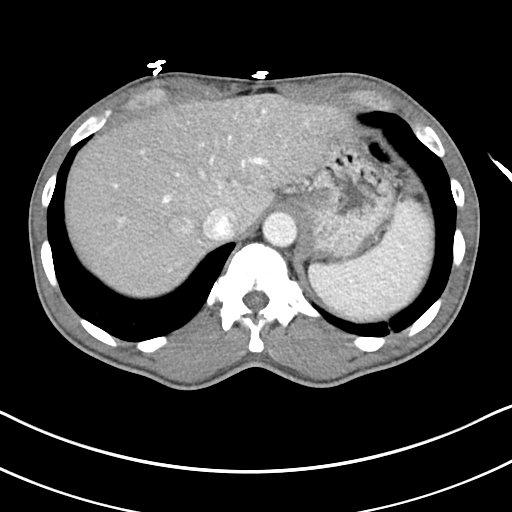
[im 72/83  lung]
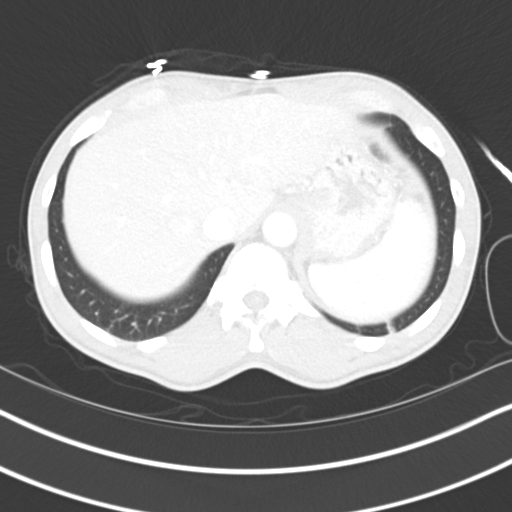

[Series 5: lung · axial · 0.67mm/px · z∈[-1115,-1075]mm · 2 of 204 slices shown]
[im 21/204  bone]
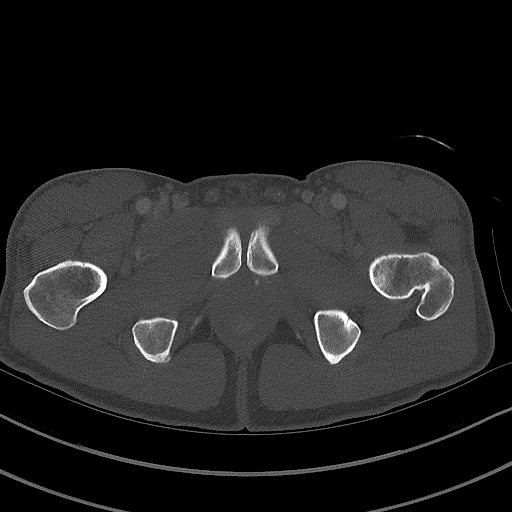
[im 41/204  bone]
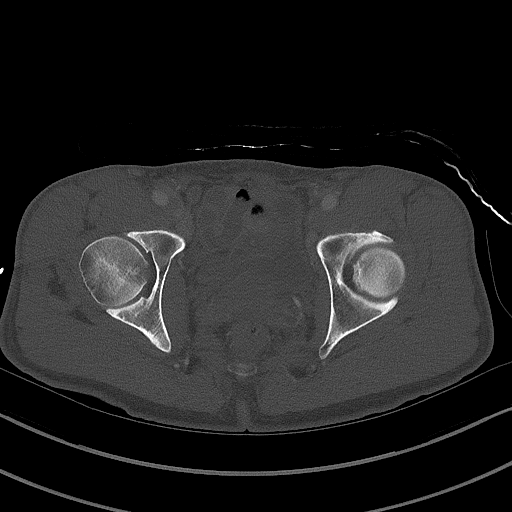

[Series 6: abdomen 3.0 mpr cor · coronal · 0.71mm/px · 3 of 64 slices shown]
[im 22/64  soft-tissue]
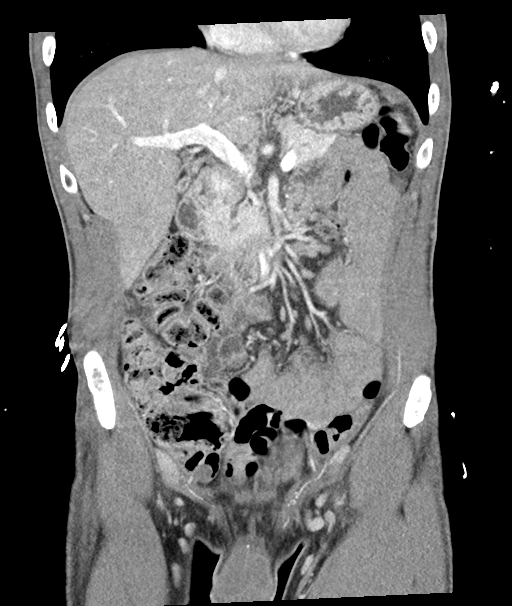
[im 29/64  soft-tissue]
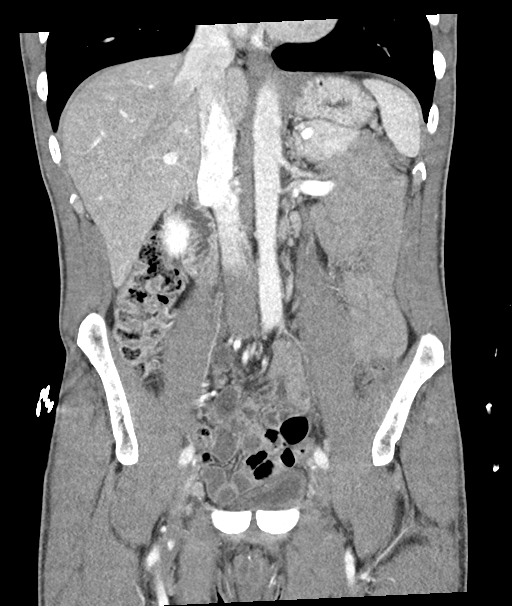
[im 36/64  soft-tissue]
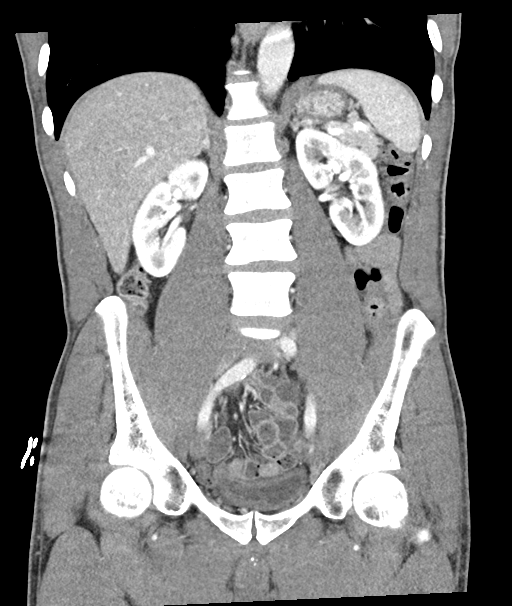

[Series 7: abdomen 3.0 mpr sag · sagittal · 0.49mm/px · 1 of 97 slices shown]
[im 33/97  soft-tissue]
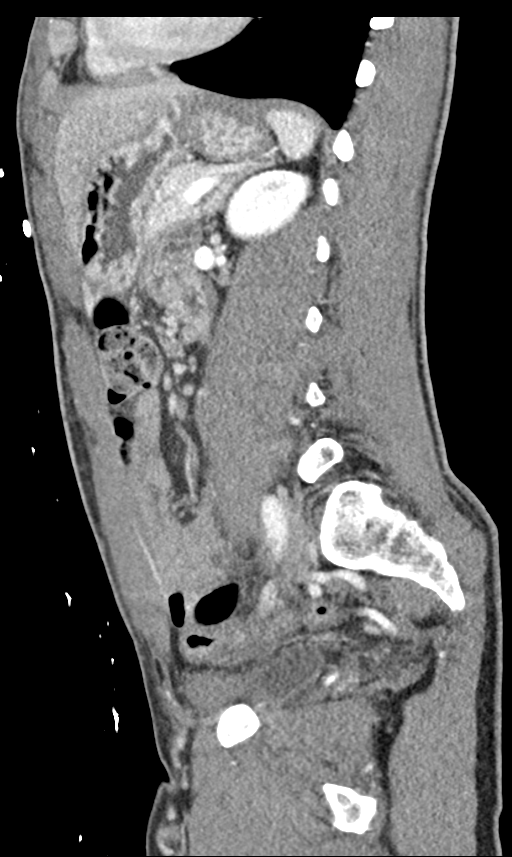

[13 of 46 positions shown; findings below may reference images not displayed]

FINDINGS: Lower chest: Lung bases are clear. No effusions. Heart is normal
size.

Hepatobiliary: No focal hepatic abnormality. Gallbladder
unremarkable.

Pancreas: No focal abnormality or ductal dilatation.

Spleen: No focal abnormality.  Normal size.

Adrenals/Urinary Tract: No adrenal abnormality. No focal renal
abnormality. No stones or hydronephrosis. Urinary bladder is
unremarkable.

Stomach/Bowel: Appendix not definitively seen. Stomach, large and
small bowel grossly unremarkable.

Vascular/Lymphatic: No evidence of aneurysm or adenopathy.

Reproductive: No visible focal abnormality.

Other: No free fluid or free air.

Musculoskeletal: No acute bony abnormality.
IMPRESSION: No acute findings in the abdomen or pelvis.
